# Patient Record
Sex: Female | Born: 1985 | Race: White | Hispanic: No | Marital: Single | Smoking: Never smoker
Health system: Southern US, Community
[De-identification: ages and names within clinical notes are randomized; demographics above are authoritative.]

## PROBLEM LIST (undated history)

## (undated) DIAGNOSIS — J45990 Exercise induced bronchospasm: Secondary | ICD-10-CM

## (undated) HISTORY — PX: WISDOM TOOTH EXTRACTION: SHX21

---
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
## 2007-04-07 ENCOUNTER — Emergency Department (HOSPITAL_COMMUNITY): Admission: EM | Admit: 2007-04-07 | Discharge: 2007-04-07 | Payer: Self-pay | Admitting: Family Medicine

## 2009-01-02 ENCOUNTER — Ambulatory Visit: Payer: Self-pay | Admitting: Unknown Physician Specialty

## 2015-09-04 ENCOUNTER — Encounter (HOSPITAL_COMMUNITY): Payer: Self-pay | Admitting: Vascular Surgery

## 2015-09-04 ENCOUNTER — Emergency Department (HOSPITAL_COMMUNITY): Payer: BLUE CROSS/BLUE SHIELD

## 2015-09-04 ENCOUNTER — Emergency Department (HOSPITAL_COMMUNITY)
Admission: EM | Admit: 2015-09-04 | Discharge: 2015-09-04 | Disposition: A | Discharge status: Home / Self Care | Payer: BLUE CROSS/BLUE SHIELD | Attending: Emergency Medicine | Admitting: Emergency Medicine

## 2015-09-04 DIAGNOSIS — S5001XA Contusion of right elbow, initial encounter: Secondary | ICD-10-CM | POA: Diagnosis not present

## 2015-09-04 DIAGNOSIS — Y9341 Activity, dancing: Secondary | ICD-10-CM | POA: Diagnosis not present

## 2015-09-04 DIAGNOSIS — W01198A Fall on same level from slipping, tripping and stumbling with subsequent striking against other object, initial encounter: Secondary | ICD-10-CM | POA: Diagnosis not present

## 2015-09-04 DIAGNOSIS — Y9289 Other specified places as the place of occurrence of the external cause: Secondary | ICD-10-CM | POA: Diagnosis not present

## 2015-09-04 DIAGNOSIS — S59901A Unspecified injury of right elbow, initial encounter: Secondary | ICD-10-CM | POA: Diagnosis present

## 2015-09-04 DIAGNOSIS — J45909 Unspecified asthma, uncomplicated: Secondary | ICD-10-CM | POA: Diagnosis not present

## 2015-09-04 DIAGNOSIS — Y998 Other external cause status: Secondary | ICD-10-CM | POA: Insufficient documentation

## 2015-09-04 HISTORY — DX: Exercise induced bronchospasm: J45.990

## 2015-09-04 MED ORDER — IBUPROFEN 400 MG PO TABS
400.0000 mg | ORAL_TABLET | Freq: Once | ORAL | Status: DC
  Filled 2015-09-04: qty 1

## 2015-09-04 NOTE — Discharge Instructions (Signed)
Rest, Ice intermittently (in the first 24-48 hours), Gentle compression with an Ace wrap, and elevate (Limb above the level of the heart)   Take up to 800mg  of ibuprofen (that is usually 4 over the counter pills)  3 times a day for 5 days. Take with food.  Only use the arm sling for up to 2 days. Take the arm out and rotate the shoulder every 4 hours.    Contusion A contusion is a deep bruise. Contusions are the result of a blunt injury to tissues and muscle fibers under the skin. The injury causes bleeding under the skin. The skin overlying the contusion may turn blue, purple, or yellow. Minor injuries will give you a painless contusion, but more severe contusions may stay painful and swollen for a few weeks.  CAUSES  This condition is usually caused by a blow, trauma, or direct force to an area of the body. SYMPTOMS  Symptoms of this condition include:  Swelling of the injured area.  Pain and tenderness in the injured area.  Discoloration. The area may have redness and then turn blue, purple, or yellow. DIAGNOSIS  This condition is diagnosed based on a physical exam and medical history. An X-ray, CT scan, or MRI may be needed to determine if there are any associated injuries, such as broken bones (fractures). TREATMENT  Specific treatment for this condition depends on what area of the body was injured. In general, the best treatment for a contusion is resting, icing, applying pressure to (compression), and elevating the injured area. This is often called the RICE strategy. Over-the-counter anti-inflammatory medicines may also be recommended for pain control.  HOME CARE INSTRUCTIONS   Rest the injured area.  If directed, apply ice to the injured area:  Put ice in a plastic bag.  Place a towel between your skin and the bag.  Leave the ice on for 20 minutes, 2-3 times per day.  If directed, apply light compression to the injured area using an elastic bandage. Make sure the bandage  is not wrapped too tightly. Remove and reapply the bandage as directed by your health care provider.  If possible, raise (elevate) the injured area above the level of your heart while you are sitting or lying down.  Take over-the-counter and prescription medicines only as told by your health care provider. SEEK MEDICAL CARE IF:  Your symptoms do not improve after several days of treatment.  Your symptoms get worse.  You have difficulty moving the injured area. SEEK IMMEDIATE MEDICAL CARE IF:   You have severe pain.  You have numbness in a hand or foot.  Your hand or foot turns pale or cold.   This information is not intended to replace advice given to you by your health care provider. Make sure you discuss any questions you have with your health care provider.   Document Released: 07/23/2005 Document Revised: 07/04/2015 Document Reviewed: 02/28/2015 Elsevier Interactive Patient Education [employer]2016 Elsevier Inc.

## 2015-09-04 NOTE — ED Notes (Signed)
Pt reports to the ED for eval of right elbow pain. Pt fell and struck her elbow on the floor. Occurred at approx 10 pm. Full ROM and CMS intact. Denies any head injury or LOC. Some swelling noted to elbow. Pt A&Ox4, resp e/u, and skin warm and dry.

## 2015-09-04 NOTE — ED Provider Notes (Signed)
CSN: [id number]     Arrival date & time 09/04/15  2157 History  By signing my name below, I, Lyndel Safe, attest that this documentation has been prepared under the direction and in the presence of [employer], PA-C. Electronically Signed: Lyndel Safe, ED Scribe. 09/04/2015. 10:13 PM.   Chief Complaint  Patient presents with  . Elbow Pain    The history is provided by the patient. No language interpreter was used.   HPI Comments: Sherry Conway is a 29 y.o. female, with no pertinent PMhx, who presents to the Emergency Department complaining of sudden onset, constant, mild right elbow pain s/p fall that occurred 1 hour ago. Pt reports while dancing she fell on her right elbow onto cement. Pt endorses intermittent numbness in her right fingers that is worse in her right, 3rd digit. Pt is right hand dominant. She has not taken any alleviating medication PTA.   Past Medical History  Diagnosis Date  . Exercise-induced asthma    Past Surgical History  Procedure Laterality Date  . Wisdom tooth extraction     No family history on file. Social History  Substance Use Topics  . Smoking status: Never Smoker   . Smokeless tobacco: Never Used  . Alcohol Use: Yes     Comment: occasionally    OB History    No data available     Review of Systems A complete 10 system review of systems was obtained and is otherwise negative except at noted in the HPI and PMH.  Allergies  Review of patient's allergies indicates not on file.  Home Medications   Prior to Admission medications   Not on File   BP 115/75 mmHg  Pulse 72  Temp(Src) 97.9 F (36.6 C) (Oral)  Resp 16  SpO2 100%  LMP 09/04/2015 Physical Exam  Constitutional: She is oriented to person, place, and time. She appears well-developed and well-nourished. No distress.  HENT:  Head: Normocephalic.  Eyes: Conjunctivae are normal.  Neck: Normal range of motion. Neck supple.  Cardiovascular: Normal rate.    Pulmonary/Chest: Effort normal. No respiratory distress.  Musculoskeletal: Normal range of motion. She exhibits tenderness. She exhibits no edema.  Right elbow with no deformity, she is distally neurovascularly intact. Patient can flex and extend, supinate and pronate the hand. She is mildly tender to palpation along the olecranon process.  Neurological: She is alert and oriented to person, place, and time. Coordination normal.  Skin: Skin is warm.  Psychiatric: She has a normal mood and affect. Her behavior is normal.  Nursing note and vitals reviewed.   ED Course  Procedures  DIAGNOSTIC STUDIES: Oxygen Saturation is 100% on RA, normal by my interpretation.    COORDINATION OF CARE: 10:11 PM Discussed treatment plan with pt at bedside and pt agreed to plan. Xray of right elbow ordered.   Imaging Review Dg Elbow Complete Right  09/04/2015  CLINICAL DATA:  Right elbow pain after injury today. Fall onto right elbow with swelling and bruising. EXAM: RIGHT ELBOW - COMPLETE 3+ VIEW COMPARISON:  None. FINDINGS: No fracture or dislocation. The alignment and joint spaces are maintained. No joint effusion. Mild soft tissue edema posteriorly. IMPRESSION: Soft tissue edema.  No fracture or dislocation. Electronically Signed   By: Rubye Oaks M.D.   On: 09/04/2015 22:52   I have personally reviewed and evaluated these images as part of my medical decision-making.   MDM   Final diagnoses:  Elbow contusion, right, initial encounter    Filed  Vitals:   09/04/15 2204  BP: 115/75  Pulse: 72  Temp: 97.9 F (36.6 C)  TempSrc: Oral  Resp: 16  SpO2: 100%    Medications  ibuprofen (ADVIL,MOTRIN) tablet 400 mg (400 mg Oral Not Given 09/04/15 2340)    Sherry Conway is 29 y.o. female presenting with right elbow pain status post slip and fall. Neurovascularly intact with full range of motion. X-rays negative. Patient given sling and encouraged rest, ice, compression  Evaluation does not  show pathology that would require ongoing emergent intervention or inpatient treatment. Pt is hemodynamically stable and mentating appropriately. Discussed findings and plan with patient/guardian, who agrees with care plan. All questions answered. Return precautions discussed and outpatient follow up given.     I personally performed the services described in this documentation, which was scribed in my presence. The recorded information has been reviewed and is accurate.    Wynetta Emeryicole Rei Contee, PA-C 09/05/15 0045  Linwood DibblesJon Knapp, MD 09/05/15 1045

## 2016-03-18 IMAGING — DX DG ELBOW COMPLETE 3+V*R*
4 series · 4 of 4 positions shown · non-contrast
Comparison: None.

CLINICAL DATA: Right elbow pain after injury today. Fall onto right
elbow with swelling and bruising.

EXAM:
RIGHT ELBOW - COMPLETE 3+ VIEW

[elbow ap]
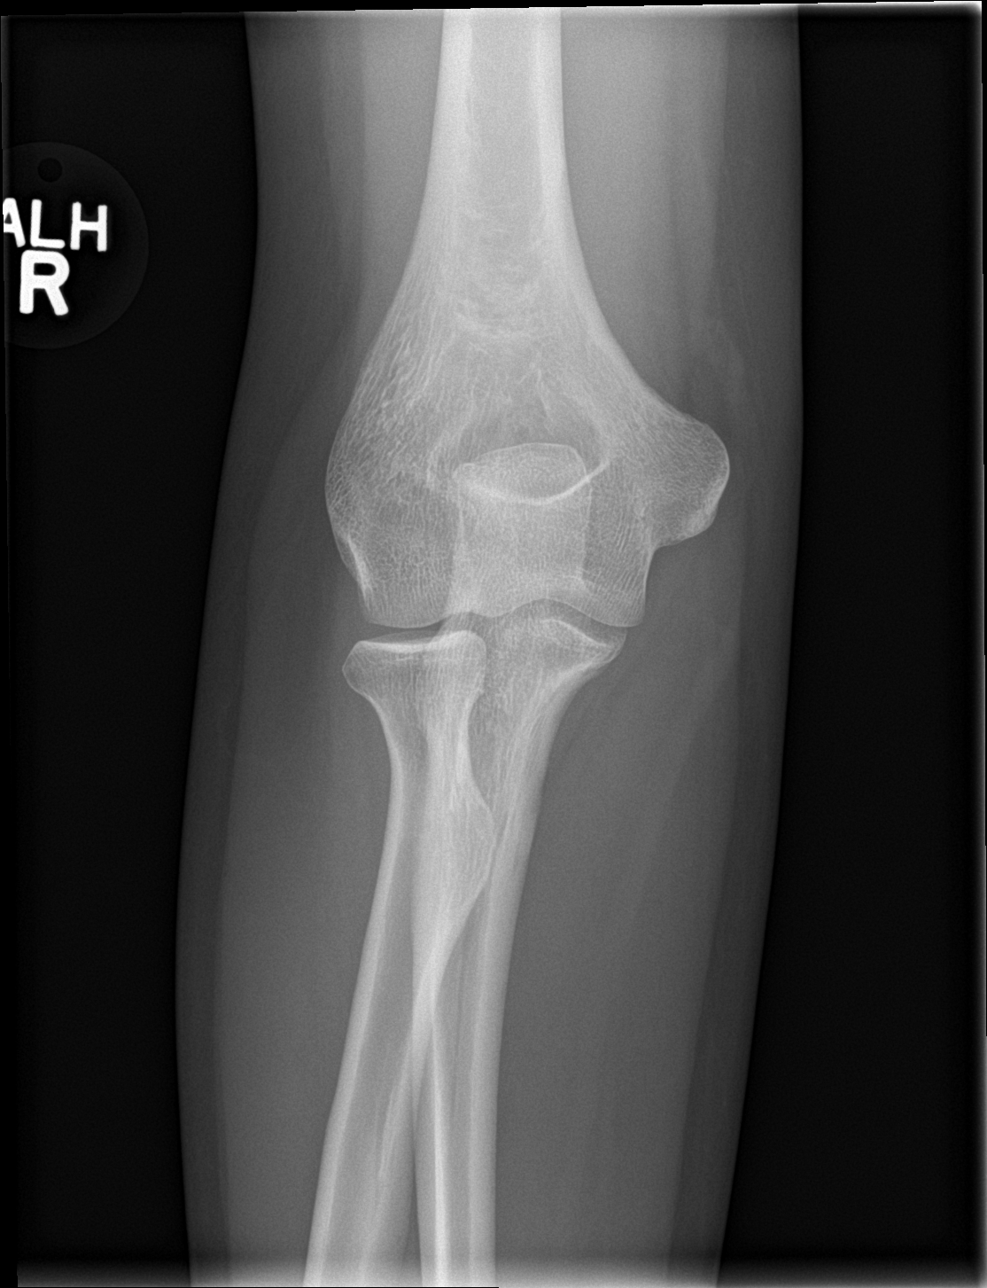

[elbow obl (1 of 2)]
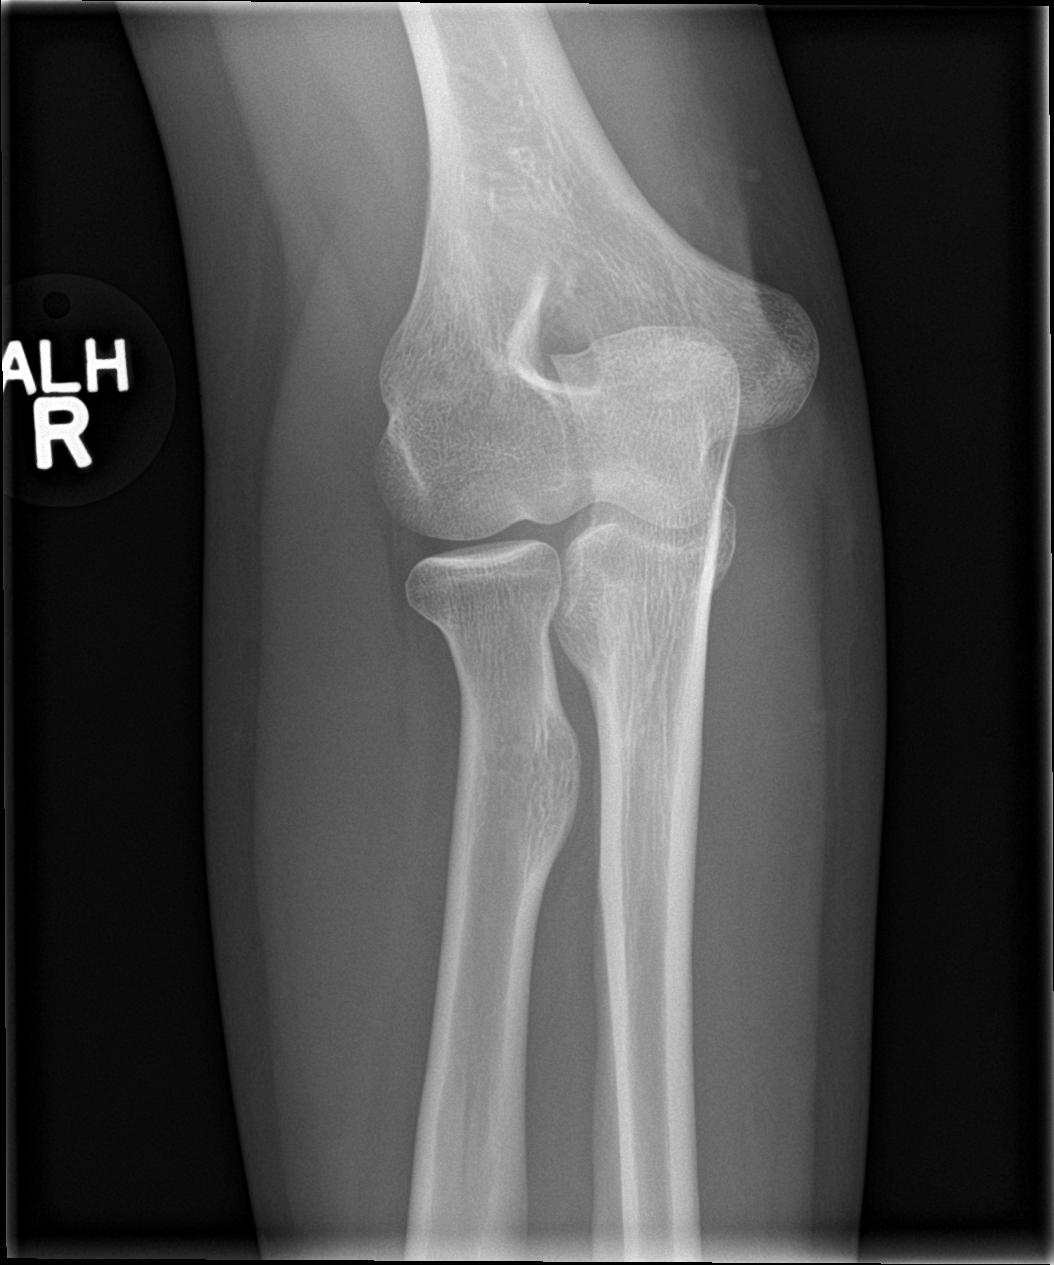

[elbow obl (2 of 2)]
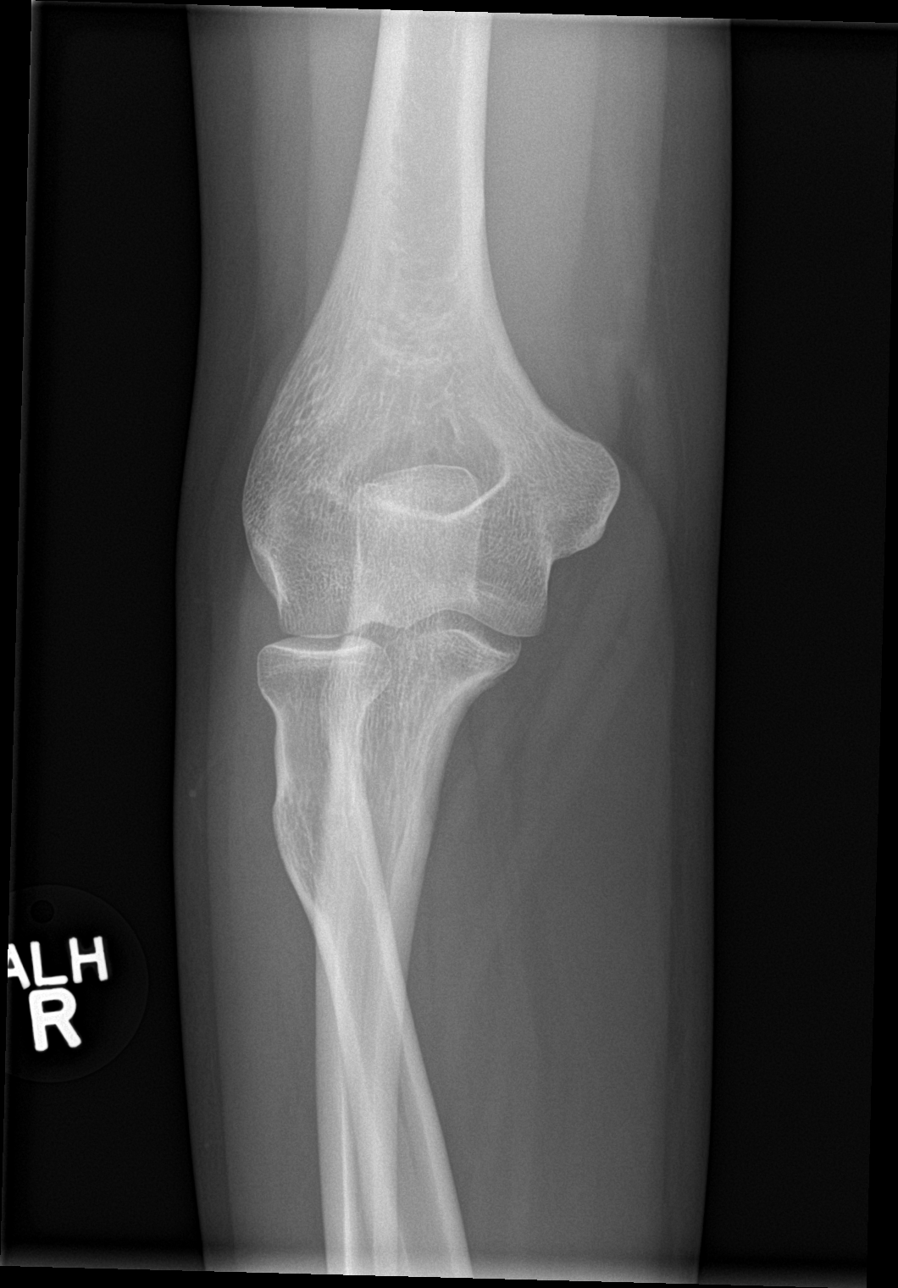

[elbow lat]
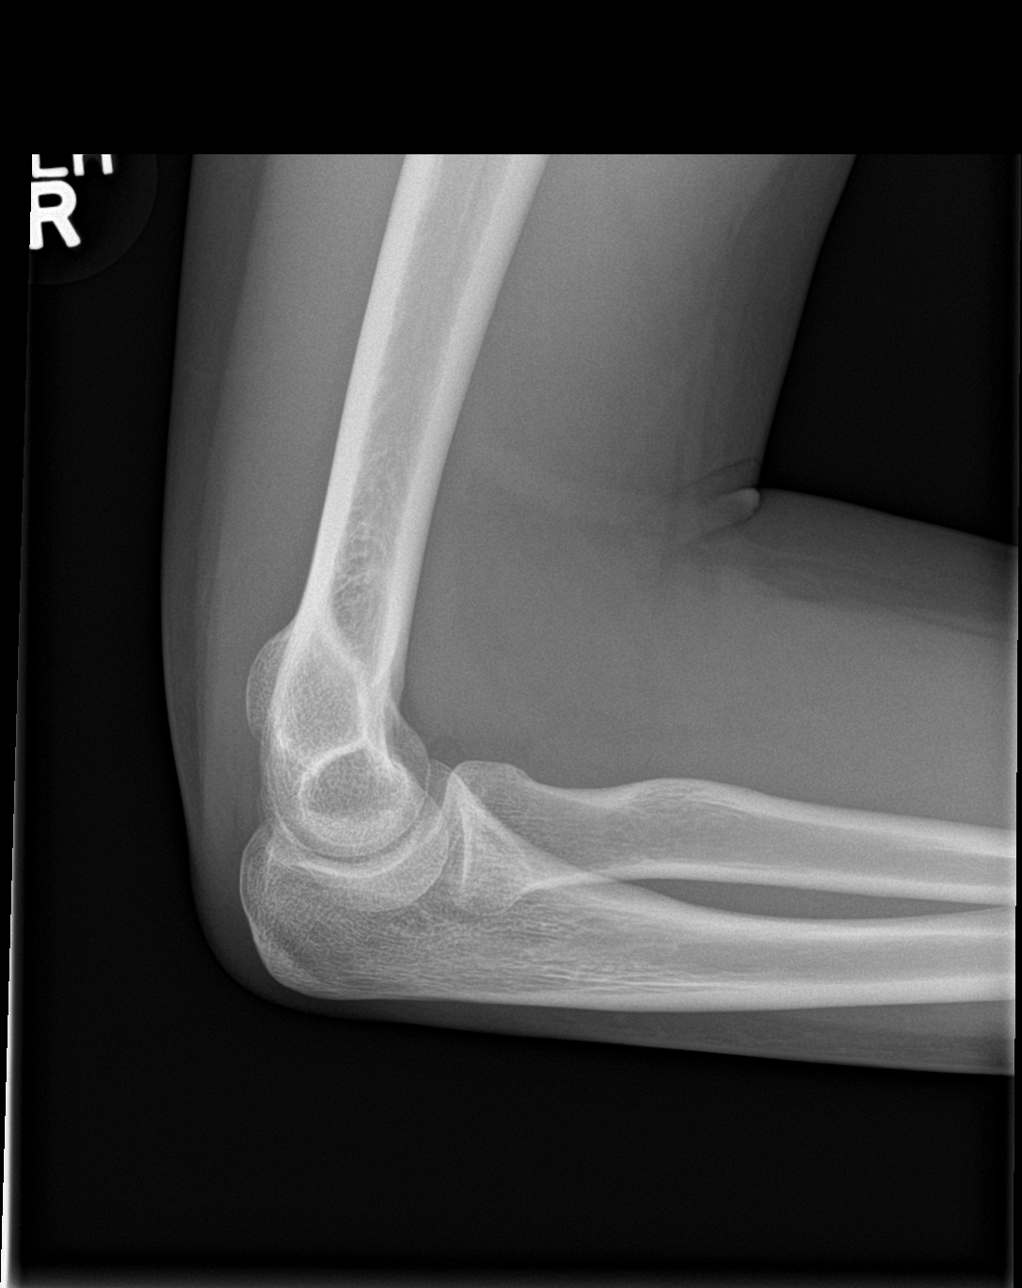

[4 of 4 positions shown; findings below may reference images not displayed]

FINDINGS: No fracture or dislocation. The alignment and joint spaces are
maintained. No joint effusion. Mild soft tissue edema posteriorly.
IMPRESSION: Soft tissue edema.  No fracture or dislocation.

## 2018-12-07 DIAGNOSIS — Z124 Encounter for screening for malignant neoplasm of cervix: Secondary | ICD-10-CM | POA: Diagnosis not present

## 2018-12-07 DIAGNOSIS — Z01419 Encounter for gynecological examination (general) (routine) without abnormal findings: Secondary | ICD-10-CM | POA: Diagnosis not present

## 2018-12-07 DIAGNOSIS — R87612 Low grade squamous intraepithelial lesion on cytologic smear of cervix (LGSIL): Secondary | ICD-10-CM | POA: Diagnosis not present

## 2018-12-07 DIAGNOSIS — Z113 Encounter for screening for infections with a predominantly sexual mode of transmission: Secondary | ICD-10-CM | POA: Diagnosis not present

## 2018-12-07 DIAGNOSIS — Z6822 Body mass index (BMI) 22.0-22.9, adult: Secondary | ICD-10-CM | POA: Diagnosis not present

## 2019-04-20 DIAGNOSIS — Z20828 Contact with and (suspected) exposure to other viral communicable diseases: Secondary | ICD-10-CM | POA: Diagnosis not present

## 2019-05-12 ENCOUNTER — Ambulatory Visit: Payer: Self-pay

## 2019-05-12 ENCOUNTER — Other Ambulatory Visit: Payer: Self-pay

## 2019-05-12 ENCOUNTER — Encounter: Payer: Self-pay | Admitting: Surgery

## 2019-05-12 ENCOUNTER — Ambulatory Visit (INDEPENDENT_AMBULATORY_CARE_PROVIDER_SITE_OTHER): Payer: BC Managed Care – PPO | Admitting: Surgery

## 2019-05-12 DIAGNOSIS — M79672 Pain in left foot: Secondary | ICD-10-CM

## 2019-05-12 NOTE — Progress Notes (Signed)
Office Visit Note   Patient: Sherry Conway           Date of Birth: 01/15/1986           MRN: [id number] Visit Date: 05/12/2019              Requested by: No referring provider defined for this encounter. PCP: Patient, No Pcp Per   Assessment & Plan: Visit Diagnoses:  1. Left foot pain   2. Pain in left foot   possible distal 3rd metatarsal stress fracture  Plan: xrays reviewed with patient.  She was put in a postop shoe.  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  Stressed to her that she should avoid this activity along with running, jumping, etc until this heals.  She may have a stress fracture of the distal 3rd metatarsal.  Will wear shoe at all times when up and ambulating. Voices understanding.  Follow up with me in one week for recheck.   Follow-Up Instructions: Return in about 1 week (around 05/19/2019) for with Ravleen Ries recheck left foot. .   Orders:  Orders Placed This Encounter  Procedures  . XR Foot Complete Left   No orders of the defined types were placed in this encounter.     Procedures: No procedures performed   Clinical Data: No additional findings.   Subjective: Chief Complaint  Patient presents with  . Left Foot - Injury, Pain    HPI 33 year old white female comes in today with complaints of right foot pain.  Patient is a Environmental consultantdance school owner/instructor and states that last night she was clogging wearing tennis shoes when she stepped down and felt a sharp pain around the right third metatarsal head.  Later she did notice some swelling and bruising around the area.  Pain in the foot with weightbearing.  Used ice off and on last night which she thinks helped the swelling but not the pain. Review of Systems No current cardiac pulmonary gi GU complaints  Objective: Vital Signs: There were no vitals taken for this visit.  Physical Exam HENT:     Head: Normocephalic and atraumatic.  Eyes:     Extraocular Movements: Extraocular  movements intact.     Pupils: Pupils are equal, round, and reactive to light.  Pulmonary:     Effort: No respiratory distress.  Musculoskeletal:        General: Swelling (Mild swelling around the third MTP joint.) present.     Comments: Patient is moderate to marked TTP around the distal third metatarsal.  No deformity.  Neurovascular intact.  Skin:    General: Skin is warm and dry.     Findings: Bruising (Trace bruising dorsal foot around the third MTP joint.) present.  Neurological:     General: No focal deficit present.     Mental Status: She is alert and oriented to person, place, and time.  Psychiatric:        Mood and Affect: Mood normal.        Behavior: Behavior normal.     Ortho Exam  Specialty Comments:  No specialty comments available.  Imaging: Xr Foot Complete Left  Result Date: 05/12/2019 X-ray right foot does not show an obvious fracture.    PMFS History: There are no active problems to display for this patient.  Past Medical History:  Diagnosis Date  . Exercise-induced asthma     History reviewed. No pertinent family history.  Past Surgical History:  Procedure Laterality  Date  . WISDOM TOOTH EXTRACTION     Social History   Occupational History  . Not on file  Tobacco Use  . Smoking status: Never Smoker  . Smokeless tobacco: Never Used  Substance and Sexual Activity  . Alcohol use: Yes    Comment: occasionally   . Drug use: No  . Sexual activity: Not on file

## 2019-05-18 ENCOUNTER — Ambulatory Visit: Payer: BC Managed Care – PPO | Admitting: Orthopaedic Surgery

## 2019-05-19 ENCOUNTER — Ambulatory Visit: Payer: Self-pay

## 2019-05-19 ENCOUNTER — Ambulatory Visit (INDEPENDENT_AMBULATORY_CARE_PROVIDER_SITE_OTHER): Payer: BC Managed Care – PPO | Admitting: Surgery

## 2019-05-19 ENCOUNTER — Encounter: Payer: Self-pay | Admitting: Surgery

## 2019-05-19 ENCOUNTER — Other Ambulatory Visit: Payer: Self-pay

## 2019-05-19 DIAGNOSIS — M79672 Pain in left foot: Secondary | ICD-10-CM | POA: Diagnosis not present

## 2019-05-19 NOTE — Progress Notes (Signed)
   Office Visit Note   Patient: Sherry Conway           Date of Birth: [birth year]           MRN: [id number] Visit Date: 05/19/2019              Requested by: No referring provider defined for this encounter. PCP: Patient, No Pcp Per   Assessment & Plan: Visit Diagnoses:  1. Pain in left foot     Plan: Patient is somewhat more tender on exam today than her previous visit.  I put her in a short cam boot and she was given crutches.  She will be non-to touchdown weightbearing with crutches until I see her back in 1 week.  Next visit if she continues to be sore and not getting any better I may consider getting an MRI at that time to rule out fracture that I do not obviously see on x-ray.  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  Patient understands no aggressive activity and being compliant with instructions to help promote healing.  Today's x-rays reviewed and all questions answered.  Follow-Up Instructions: Return in about 1 week (around 05/26/2019) for with [facility] recheck foot. .   Orders:  Orders Placed This Encounter  Procedures  . XR Foot Complete Left   No orders of the defined types were placed in this encounter.     Procedures: No procedures performed   Clinical Data: No additional findings.   Subjective: Chief Complaint  Patient presents with  . Left Foot - Follow-up    HPI 33 year old female returns for recheck of her left foot pain.  She has been compliant with wearing the postop shoe.  She has continued to have some soreness around the distal third metatarsal.  Since last visit states that she did have an event where she jumped up at her school and she came down hard while wearing the shoe.  Pain somewhat increased since that time.     Objective: Vital Signs: There were no vitals taken for this visit.   Physical Exam Extremely pleasant female alert and oriented in no acute distress.  Gait is antalgic.  Left foot there is possibly some mild swelling around the distal third metatarsal.  No obvious bruising.  She is exquisitely tender at the distal third metatarsal.  No bony deformity.  Neurovascular intact. Ortho Exam  Specialty Comments:  No specialty comments available.  Imaging: No results found.   PMFS History: There are no active problems to display for this patient.  Past Medical History:  Diagnosis Date  . Exercise-induced asthma     No family history on file.  Past Surgical History:  Procedure Laterality Date  . WISDOM TOOTH EXTRACTION     Social History   Occupational History  . Not on file  Tobacco Use  . Smoking status: Never Smoker  . Smokeless tobacco: Never Used  Substance and Sexual Activity  . Alcohol use: Yes    Comment: occasionally   . Drug use: No  . Sexual activity: Not on file

## 2019-05-25 ENCOUNTER — Ambulatory Visit (INDEPENDENT_AMBULATORY_CARE_PROVIDER_SITE_OTHER): Payer: BC Managed Care – PPO | Admitting: Surgery

## 2019-05-25 ENCOUNTER — Other Ambulatory Visit: Payer: Self-pay

## 2019-05-25 ENCOUNTER — Encounter: Payer: Self-pay | Admitting: Surgery

## 2019-05-25 VITALS — BP 121/76 | HR 67

## 2019-05-25 DIAGNOSIS — M79672 Pain in left foot: Secondary | ICD-10-CM | POA: Diagnosis not present

## 2019-05-26 ENCOUNTER — Encounter: Payer: Self-pay | Admitting: Surgery

## 2019-05-26 NOTE — Progress Notes (Signed)
   Office Visit Note   Patient: Sherry Conway           Date of Birth: 1986-01-22           MRN: [id number] Visit Date: 05/25/2019              Requested by: No referring provider defined for this encounter. PCP: Patient, No Pcp Per   Assessment & Plan: Visit Diagnoses:  1. Pain in left foot   Possible distal third metatarsal stress fracture.  Plan: We will have patient continue short cam boot until I see her back in a couple of weeks.  She is planning on going to the beach next week so I will hold off on getting an MRI and will make decision on that when I see her back.  Stressed her the importance of being compliant wearing the boot.  Told her that she should not be getting in and out of the pool or walking on the sand or in the ocean.  She will come out of the boot at times to work range of motion of her ankle to prevent any stiffness that she might get there.  Elevate foot as much as possible to help decrease swelling.  She will contact me if there are any questions or concerns before her appointment.  Follow-Up Instructions: Return in about 15 days (around 06/09/2019).   Orders:  No orders of the defined types were placed in this encounter.  No orders of the defined types were placed in this encounter.     Procedures: No procedures performed   Clinical Data: No additional findings.   Subjective: Chief Complaint  Patient presents with  . Left Foot - Follow-up    HPI 33 year old white female returns for recheck of her left foot pain and possible distal third metatarsal stress fracture.  She states that she is not really gotten any better.  She has been trying to be compliant with weightbearing restrictions in her boot.  States that she does live alone so this has been challenging.  Thinks that she still continues to have swelling in her foot.   Objective: Vital Signs: BP 121/76   Pulse 67   Physical Exam She does have mild swelling of the mid to forefoot.  No  obvious bruising.  She is exquisitely tender over the distal third metatarsal.  Neurovascular intact.  Today she is also mildly tender around the second and fourth metatarsal as well but definitely not as bad as a third. Ortho Exam  Specialty Comments:  No specialty comments available.  Imaging: No results found.   PMFS History: There are no active problems to display for this patient.  Past Medical History:  Diagnosis Date  . Exercise-induced asthma     No family history on file.  Past Surgical History:  Procedure Laterality Date  . WISDOM TOOTH EXTRACTION     Social History   Occupational History  . Not on file  Tobacco Use  . Smoking status: Never Smoker  . Smokeless tobacco: Never Used  Substance and Sexual Activity  . Alcohol use: Yes    Comment: occasionally   . Drug use: No  . Sexual activity: Not on file

## 2019-06-09 ENCOUNTER — Ambulatory Visit (INDEPENDENT_AMBULATORY_CARE_PROVIDER_SITE_OTHER): Payer: BC Managed Care – PPO | Admitting: Surgery

## 2019-06-09 ENCOUNTER — Encounter: Payer: Self-pay | Admitting: Surgery

## 2019-06-09 ENCOUNTER — Ambulatory Visit: Payer: BC Managed Care – PPO

## 2019-06-09 DIAGNOSIS — M79672 Pain in left foot: Secondary | ICD-10-CM | POA: Diagnosis not present

## 2019-06-09 NOTE — Progress Notes (Signed)
   Office Visit Note   Patient: Sherry Conway           Date of Birth: 12-26-1985           MRN: [id number] Visit Date: 06/09/2019              Requested by: No referring provider defined for this encounter. PCP: Patient, No Pcp Per   Assessment & Plan: Visit Diagnoses:  1. Pain in left foot     Plan: I think it is reasonable for patient to transition to a postop shoe at this time.  I encouraged her to try to walk a little more normal while using the shoe and she can use crutches to weight-bear as tolerated.  Hold off on walking on the heel. X-rays reviewed with patient.  I think since she is improving we can hold off on getting an MRI.  We will see how she feels in 2 weeks when she is about 6 weeks out from injury.  Will call if there are any worsening issues.  Follow-Up Instructions: Return in about 2 weeks (around 06/23/2019) for With [facility] recheck foot.   Orders:  Orders Placed This Encounter  Procedures  . XR Foot Complete Left   No orders of the defined types were placed in this encounter.     Procedures: No procedures performed   Clinical Data: No additional findings.   Subjective: Chief Complaint  Patient presents with  . Left Foot - Follow-up    HPI 33 year old white female returns for recheck of her left foot possible distal third metatarsal stress fracture.  She has been compliant with wearing the cam boot.  Complaining of decreased pain at the distal third metatarsal.  She has been trying to walk more on her heel in the boot which is causing some discomfort at the plantar fascia calcaneal insertion.  No problems while she was at the beach.   Objective: Vital Signs: There were no vitals taken for this visit.  Physical Exam Patient still does have some tenderness at the distal third that is aggravated somewhat with toe dorsiflexion.  But this is decreased from previous exam a couple weeks ago.  No swelling or bruising.  She does have some mild  tenderness over the dorsal second metatarsal and this is somewhat aggravated with toe dorsiflexion.  Neurovascular intact. Ortho Exam  Specialty Comments:  No specialty comments available.  Imaging: No results found.   PMFS History: There are no active problems to display for this patient.  Past Medical History:  Diagnosis Date  . Exercise-induced asthma     No family history on file.  Past Surgical History:  Procedure Laterality Date  . WISDOM TOOTH EXTRACTION     Social History   Occupational History  . Not on file  Tobacco Use  . Smoking status: Never Smoker  . Smokeless tobacco: Never Used  Substance and Sexual Activity  . Alcohol use: Yes    Comment: occasionally   . Drug use: No  . Sexual activity: Not on file

## 2019-06-22 ENCOUNTER — Ambulatory Visit: Payer: BC Managed Care – PPO | Admitting: Surgery

## 2019-06-27 ENCOUNTER — Telehealth: Payer: Self-pay | Admitting: Surgery

## 2019-06-27 ENCOUNTER — Other Ambulatory Visit: Payer: Self-pay | Admitting: Surgery

## 2019-06-27 DIAGNOSIS — M79672 Pain in left foot: Secondary | ICD-10-CM

## 2019-06-27 NOTE — Telephone Encounter (Signed)
Please advise 

## 2019-06-27 NOTE — Telephone Encounter (Signed)
Order for MRI left foot entered. I called patient and advised that facility will call to schedule once we have insurance authorization. I did explain to patient that Sherry Conway would like for her to follow up and review MRI with Dr. Marlou Sa.

## 2019-06-27 NOTE — Telephone Encounter (Signed)
Please order MRI left foot.  Rule out distal 3rd distal metatarsal boney lesion stress reaction), soft tissue injury and 2nd and 3rd Morton's neuroma.  Schedule rov with Dr Marlou Sa after for exam and discuss results.

## 2019-06-27 NOTE — Telephone Encounter (Signed)
Patient called advised left foot is hurting her more than expected. Patient asked if Jeneen Rinks can call her to discuss how she is feeling. The number to contact patient is [phone]

## 2019-06-28 ENCOUNTER — Telehealth: Payer: Self-pay | Admitting: Surgery

## 2019-06-28 NOTE — Telephone Encounter (Signed)
I spoke with this 33 year old patient yesterday regarding her left foot.  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  States that since last office visit with me she has had ongoing pain and some swelling of the forefoot.  Continues localized pain to the distal third metatarsal but is also had some discomfort around the distal second as well.  She has been going back and forth with the cam boot and postop shoe.  At this time I think that we have given this long enough to see if it would get better on its own.  She has a very demanding job with what she does and this lingering problem is having a significant negative impact on that.  I recommend getting an MRI of the left foot to rule out bony lesion of the distal third and second metatarsal, soft tissue injury, and also second and third Morton's neuroma.  Follow-up with Dr. Alphonzo Severance to review study after completed and to discuss further options at that point.

## 2019-06-28 NOTE — Addendum Note (Signed)
Addended by: Daylene Posey T on: 06/28/2019 10:42 AM   Modules accepted: Orders

## 2019-06-29 ENCOUNTER — Ambulatory Visit: Payer: BC Managed Care – PPO | Admitting: Surgery

## 2019-06-30 ENCOUNTER — Telehealth: Payer: Self-pay | Admitting: *Deleted

## 2019-06-30 NOTE — Telephone Encounter (Signed)
Sherry Conway is wanting pt to be seen with Dr. Marlou Sa for follow up MRI ankle/foot, pt is scheduled on 07/07/19 at 945am, tried calling pt to inform her of appt no anwer, vm full.

## 2019-07-01 NOTE — Telephone Encounter (Signed)
I called pt left vm with appt information

## 2019-07-06 DIAGNOSIS — M79672 Pain in left foot: Secondary | ICD-10-CM | POA: Diagnosis not present

## 2019-07-07 ENCOUNTER — Encounter: Payer: Self-pay | Admitting: Orthopedic Surgery

## 2019-07-07 ENCOUNTER — Ambulatory Visit (INDEPENDENT_AMBULATORY_CARE_PROVIDER_SITE_OTHER): Payer: BC Managed Care – PPO | Admitting: Orthopedic Surgery

## 2019-07-07 DIAGNOSIS — M79672 Pain in left foot: Secondary | ICD-10-CM

## 2019-07-08 ENCOUNTER — Telehealth: Payer: Self-pay | Admitting: Surgical

## 2019-07-08 NOTE — Telephone Encounter (Signed)
Discussed MRI results.Pt will stick with the plan from her visit on 9/10 and will return to the office if little to no improvement in the next month

## 2019-07-09 ENCOUNTER — Encounter: Payer: Self-pay | Admitting: Orthopedic Surgery

## 2019-07-09 NOTE — Progress Notes (Signed)
Office Visit Note   Patient: Mitsue C Gorder           Date of Birth: 1986-02-11           MRN: [id number] Visit Date: 07/07/2019 Requested by: No referring provider defined for this encounter. PCP: Patient, No Pcp Per  Subjective: Chief Complaint  Patient presents with  . Left Foot - Follow-up    HPI: Djeneba C Shelton SilvasStraughn is a 33 y.o. female who presents to the office complaining of left foot pain.  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  She noticed immediate pain, swelling with eventual bruising of the plantar aspect of the left foot.  She notes tingling, burning pain.  She has not been taking any medications for pain.  She notes numbness in the 2nd-4th toes.  She has been walking with a postoperative fracture shoe. 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  She has not been able to return to dance.              ROS:  All systems reviewed are negative as they relate to the chief complaint within the history of present illness.  Patient denies fevers or chills.  Assessment & Plan: Visit Diagnoses: No diagnosis found.  Plan: Patient is a 33 year old female who presents following injury 7 weeks ago.  She has numbness in the second through fourth toes and burning quality pain around the 3rd-4th metatarsal head.  MRI of the left foot reviewed with patient and revealed soft tissue swelling just distal to the second third fourth metatarsal heads.  There is no evidence of a Morton's neuroma or stress fracture.  Recommended patient try to resume walking in a normal shoe as she can tolerate and slowly transition to walking more normally and handling stairs.  When she is comfortable handling stairs without any pain I think she should be able to try returning to dancing.  Patient agreed with this plan and will follow-up with the office if her pain does not improve.  Follow-Up  Instructions: No follow-ups on file.   Orders:  No orders of the defined types were placed in this encounter.  No orders of the defined types were placed in this encounter.     Procedures: No procedures performed   Clinical Data: No additional findings.  Objective: Vital Signs: There were no vitals taken for this visit.  Physical Exam:  Constitutional: Patient appears well-developed HEENT:  Head: Normocephalic Eyes:EOM are normal Neck: Normal range of motion Cardiovascular: Normal rate Pulmonary/chest: Effort normal Neurologic: Patient is alert Skin: Skin is warm Psychiatric: Patient has normal mood and affect  Ortho Exam:  Left foot exam No pain throughout the hindfoot No pain with stress of the Lisfranc ligament Intact sensation of the first and fifth digits.  Somewhat reduced sensation of the second, third, fourth digits TTP over the distal third metatarsal and distal fourth metatarsal No significant bruising or swelling is seen  Specialty Comments:  No specialty comments available.  Imaging: No results found.   PMFS History: There are no active problems to display for this patient.  Past Medical History:  Diagnosis Date  . Exercise-induced asthma     History reviewed. No pertinent family history.  Past Surgical History:  Procedure Laterality Date  . WISDOM TOOTH EXTRACTION     Social History   Occupational History  .  Not on file  Tobacco Use  . Smoking status: Never Smoker  . Smokeless tobacco: Never Used  Substance and Sexual Activity  . Alcohol use: Yes    Comment: occasionally   . Drug use: No  . Sexual activity: Not on file

## 2019-07-11 ENCOUNTER — Encounter: Payer: Self-pay | Admitting: Orthopedic Surgery

## 2019-08-31 DIAGNOSIS — J209 Acute bronchitis, unspecified: Secondary | ICD-10-CM | POA: Diagnosis not present

## 2019-09-13 DIAGNOSIS — N760 Acute vaginitis: Secondary | ICD-10-CM | POA: Diagnosis not present

## 2019-09-13 DIAGNOSIS — Z6822 Body mass index (BMI) 22.0-22.9, adult: Secondary | ICD-10-CM | POA: Diagnosis not present

## 2019-10-10 DIAGNOSIS — Z20828 Contact with and (suspected) exposure to other viral communicable diseases: Secondary | ICD-10-CM | POA: Diagnosis not present

## 2019-10-17 DIAGNOSIS — Z20828 Contact with and (suspected) exposure to other viral communicable diseases: Secondary | ICD-10-CM | POA: Diagnosis not present

## 2019-10-17 DIAGNOSIS — J069 Acute upper respiratory infection, unspecified: Secondary | ICD-10-CM | POA: Diagnosis not present

## 2020-05-16 DIAGNOSIS — L299 Pruritus, unspecified: Secondary | ICD-10-CM | POA: Diagnosis not present

## 2020-05-16 DIAGNOSIS — H938X1 Other specified disorders of right ear: Secondary | ICD-10-CM | POA: Diagnosis not present

## 2020-05-16 DIAGNOSIS — H6123 Impacted cerumen, bilateral: Secondary | ICD-10-CM | POA: Diagnosis not present

## 2020-07-03 DIAGNOSIS — Z20822 Contact with and (suspected) exposure to covid-19: Secondary | ICD-10-CM | POA: Diagnosis not present

## 2020-10-03 DIAGNOSIS — Z6822 Body mass index (BMI) 22.0-22.9, adult: Secondary | ICD-10-CM | POA: Diagnosis not present

## 2020-10-03 DIAGNOSIS — H5711 Ocular pain, right eye: Secondary | ICD-10-CM | POA: Diagnosis not present

## 2020-10-03 DIAGNOSIS — H16101 Unspecified superficial keratitis, right eye: Secondary | ICD-10-CM | POA: Diagnosis not present

## 2020-10-03 DIAGNOSIS — H1011 Acute atopic conjunctivitis, right eye: Secondary | ICD-10-CM | POA: Diagnosis not present

## 2020-10-05 DIAGNOSIS — H16101 Unspecified superficial keratitis, right eye: Secondary | ICD-10-CM | POA: Diagnosis not present

## 2020-10-16 DIAGNOSIS — R87612 Low grade squamous intraepithelial lesion on cytologic smear of cervix (LGSIL): Secondary | ICD-10-CM | POA: Diagnosis not present

## 2020-10-16 DIAGNOSIS — Z6822 Body mass index (BMI) 22.0-22.9, adult: Secondary | ICD-10-CM | POA: Diagnosis not present

## 2020-10-16 DIAGNOSIS — Z124 Encounter for screening for malignant neoplasm of cervix: Secondary | ICD-10-CM | POA: Diagnosis not present

## 2020-10-16 DIAGNOSIS — Z01419 Encounter for gynecological examination (general) (routine) without abnormal findings: Secondary | ICD-10-CM | POA: Diagnosis not present

## 2021-01-17 DIAGNOSIS — R197 Diarrhea, unspecified: Secondary | ICD-10-CM | POA: Diagnosis not present

## 2021-01-17 DIAGNOSIS — M62830 Muscle spasm of back: Secondary | ICD-10-CM | POA: Diagnosis not present

## 2021-01-17 DIAGNOSIS — Z6822 Body mass index (BMI) 22.0-22.9, adult: Secondary | ICD-10-CM | POA: Diagnosis not present

## 2021-01-17 DIAGNOSIS — H6092 Unspecified otitis externa, left ear: Secondary | ICD-10-CM | POA: Diagnosis not present

## 2021-01-24 DIAGNOSIS — H938X1 Other specified disorders of right ear: Secondary | ICD-10-CM | POA: Diagnosis not present

## 2021-09-10 DIAGNOSIS — M7912 Myalgia of auxiliary muscles, head and neck: Secondary | ICD-10-CM | POA: Diagnosis not present

## 2021-09-10 DIAGNOSIS — M5385 Other specified dorsopathies, thoracolumbar region: Secondary | ICD-10-CM | POA: Diagnosis not present

## 2021-09-10 DIAGNOSIS — M9901 Segmental and somatic dysfunction of cervical region: Secondary | ICD-10-CM | POA: Diagnosis not present

## 2021-09-10 DIAGNOSIS — M9902 Segmental and somatic dysfunction of thoracic region: Secondary | ICD-10-CM | POA: Diagnosis not present

## 2021-09-17 DIAGNOSIS — M9902 Segmental and somatic dysfunction of thoracic region: Secondary | ICD-10-CM | POA: Diagnosis not present

## 2021-09-17 DIAGNOSIS — M7912 Myalgia of auxiliary muscles, head and neck: Secondary | ICD-10-CM | POA: Diagnosis not present

## 2021-09-17 DIAGNOSIS — M5385 Other specified dorsopathies, thoracolumbar region: Secondary | ICD-10-CM | POA: Diagnosis not present

## 2021-09-17 DIAGNOSIS — M9901 Segmental and somatic dysfunction of cervical region: Secondary | ICD-10-CM | POA: Diagnosis not present

## 2021-09-24 DIAGNOSIS — M9902 Segmental and somatic dysfunction of thoracic region: Secondary | ICD-10-CM | POA: Diagnosis not present

## 2021-09-24 DIAGNOSIS — M9901 Segmental and somatic dysfunction of cervical region: Secondary | ICD-10-CM | POA: Diagnosis not present

## 2021-09-24 DIAGNOSIS — M7912 Myalgia of auxiliary muscles, head and neck: Secondary | ICD-10-CM | POA: Diagnosis not present

## 2021-09-24 DIAGNOSIS — M5385 Other specified dorsopathies, thoracolumbar region: Secondary | ICD-10-CM | POA: Diagnosis not present

## 2021-10-17 DIAGNOSIS — Z130 Encounter for screening for diseases of the blood and blood-forming organs and certain disorders involving the immune mechanism: Secondary | ICD-10-CM | POA: Diagnosis not present

## 2021-10-17 DIAGNOSIS — Z01419 Encounter for gynecological examination (general) (routine) without abnormal findings: Secondary | ICD-10-CM | POA: Diagnosis not present

## 2021-10-17 DIAGNOSIS — Z6823 Body mass index (BMI) 23.0-23.9, adult: Secondary | ICD-10-CM | POA: Diagnosis not present

## 2021-10-17 DIAGNOSIS — Z13220 Encounter for screening for lipoid disorders: Secondary | ICD-10-CM | POA: Diagnosis not present

## 2021-10-17 DIAGNOSIS — Z1329 Encounter for screening for other suspected endocrine disorder: Secondary | ICD-10-CM | POA: Diagnosis not present

## 2021-11-28 DIAGNOSIS — Z3202 Encounter for pregnancy test, result negative: Secondary | ICD-10-CM | POA: Diagnosis not present

## 2021-11-28 DIAGNOSIS — Z3040 Encounter for surveillance of contraceptives, unspecified: Secondary | ICD-10-CM | POA: Diagnosis not present

## 2021-11-28 DIAGNOSIS — Z319 Encounter for procreative management, unspecified: Secondary | ICD-10-CM | POA: Diagnosis not present

## 2021-12-21 DIAGNOSIS — R0600 Dyspnea, unspecified: Secondary | ICD-10-CM | POA: Diagnosis not present

## 2021-12-21 DIAGNOSIS — J209 Acute bronchitis, unspecified: Secondary | ICD-10-CM | POA: Diagnosis not present

## 2021-12-21 DIAGNOSIS — J218 Acute bronchiolitis due to other specified organisms: Secondary | ICD-10-CM | POA: Diagnosis not present

## 2022-04-03 DIAGNOSIS — Z3040 Encounter for surveillance of contraceptives, unspecified: Secondary | ICD-10-CM | POA: Diagnosis not present

## 2022-04-03 DIAGNOSIS — F909 Attention-deficit hyperactivity disorder, unspecified type: Secondary | ICD-10-CM | POA: Diagnosis not present

## 2022-06-04 NOTE — Progress Notes (Deleted)
Psychiatric Initial Adult Assessment   Patient Identification: Sherry Conway MRN:  [id number] Date of Evaluation:  06/04/2022 Referral Source: *** Chief Complaint:  No chief complaint on file.  Visit Diagnosis: No diagnosis found.  History of Present Illness:   Sherry Conway is a 36 y.o. year old female with a history of adhd, endometriosis, who is referred for ADHD.   Conception?    Daily routine: Diet:  Exercise: Support: Household:  Marital status: Number of children: Employment:  Education:   Last PCP / ongoing medical evaluation:      Associated Signs/Symptoms: Depression Symptoms:  {DEPRESSION SYMPTOMS:20000} (Hypo) Manic Symptoms:  {BHH MANIC SYMPTOMS:22872} Anxiety Symptoms:  {BHH ANXIETY SYMPTOMS:22873} Psychotic Symptoms:  {BHH PSYCHOTIC SYMPTOMS:22874} PTSD Symptoms: {BHH PTSD SYMPTOMS:22875}  Past Psychiatric History:  Outpatient:  Psychiatry admission:  Previous suicide attempt:  Past trials of medication:  History of violence:    Previous Psychotropic Medications: {YES/NO:21197}  Substance Abuse History in the last 12 months:  {yes no:314532}  Consequences of Substance Abuse: {BHH CONSEQUENCES OF SUBSTANCE ABUSE:22880}  Past Medical History:  Past Medical History:  Diagnosis Date   Exercise-induced asthma     Past Surgical History:  Procedure Laterality Date   WISDOM TOOTH EXTRACTION      Family Psychiatric History: ***  Family History: No family history on file.  Social History:   Social History   Socioeconomic History   Marital status: Single    Spouse name: Not on file   Number of children: Not on file   Years of education: Not on file   Highest education level: Not on file  Occupational History   Not on file  Tobacco Use   Smoking status: Never   Smokeless tobacco: Never  Substance and Sexual Activity   Alcohol use: Yes    Comment: occasionally    Drug use: No   Sexual activity: Not on file  Other Topics Concern    Not on file  Social History Narrative   Not on file   Social Determinants of Health   Financial Resource Strain: Not on file  Food Insecurity: Not on file  Transportation Needs: Not on file  Physical Activity: Not on file  Stress: Not on file  Social Connections: Not on file    Additional Social History: ***  Allergies:   Allergies  Allergen Reactions   Macrobid [Nitrofurantoin]     Fast heart rate   Urelle     Fast heart rate    Metabolic Disorder Labs: No results found for: "HGBA1C", "MPG" No results found for: "PROLACTIN" No results found for: "CHOL", "TRIG", "HDL", "CHOLHDL", "VLDL", "LDLCALC" No results found for: "TSH"  Therapeutic Level Labs: No results found for: "LITHIUM" No results found for: "CBMZ" No results found for: "VALPROATE"  Current Medications: No current outpatient medications on file.   No current facility-administered medications for this visit.    Musculoskeletal: Strength & Muscle Tone:  N/A Gait & Station:  N/A Patient leans: N/A  Psychiatric Specialty Exam: Review of Systems  There were no vitals taken for this visit.There is no height or weight on file to calculate BMI.  General Appearance: {Appearance:22683}  Eye Contact:  {BHH EYE CONTACT:22684}  Speech:  Clear and Coherent  Volume:  Normal  Mood:  {BHH MOOD:22306}  Affect:  {Affect (PAA):22687}  Thought Process:  Coherent  Orientation:  Full (Time, Place, and Person)  Thought Content:  Logical  Suicidal Thoughts:  {ST/HT (PAA):22692}  Homicidal Thoughts:  {ST/HT (PAA):22692}  Memory:  Immediate;   Good  Judgement:  {Judgement (PAA):22694}  Insight:  {Insight (PAA):22695}  Psychomotor Activity:  Normal  Concentration:  Concentration: Good and Attention Span: Good  Recall:  Good  Fund of Knowledge:Good  Language: Good  Akathisia:  No  Handed:  Right  AIMS (if indicated):  not done  Assets:  Communication Skills Desire for Improvement  ADL's:  Intact  Cognition:  WNL  Sleep:  {BHH GOOD/FAIR/POOR:22877}   Screenings:   Assessment and Plan:  Assessment  Plan   The patient demonstrates the following risk factors for suicide: Chronic risk factors for suicide include: {Chronic Risk Factors for NTIRWER:15400867}. Acute risk factors for suicide include: {Acute Risk Factors for YPPJKDT:26712458}. Protective factors for this patient include: {Protective Factors for Suicide KDXI:33825053}. Considering these factors, the overall suicide risk at this point appears to be {Desc; low/moderate/high:110033}. Patient {ACTION; IS/IS ZJQ:73419379} appropriate for outpatient follow up.       Collaboration of Care: {BH OP Collaboration of Care:21014065}  Patient/Guardian was advised Release of Information must be obtained prior to any record release in order to collaborate their care with an outside provider. Patient/Guardian was advised if they have not already done so to contact the registration department to sign all necessary forms in order for [place] to release information regarding their care.   Consent: Patient/Guardian gives verbal consent for treatment and assignment of benefits for services provided during this visit. Patient/Guardian expressed understanding and agreed to proceed.   Neysa Hotter, MD 8/9/202311:41 AM

## 2022-06-07 ENCOUNTER — Ambulatory Visit (HOSPITAL_COMMUNITY): Payer: Self-pay | Admitting: Psychiatry

## 2022-07-18 DIAGNOSIS — L299 Pruritus, unspecified: Secondary | ICD-10-CM | POA: Diagnosis not present

## 2022-07-18 DIAGNOSIS — H6121 Impacted cerumen, right ear: Secondary | ICD-10-CM | POA: Diagnosis not present

## 2022-08-30 DIAGNOSIS — M25562 Pain in left knee: Secondary | ICD-10-CM | POA: Diagnosis not present

## 2022-10-17 DIAGNOSIS — U071 COVID-19: Secondary | ICD-10-CM | POA: Diagnosis not present

## 2022-10-17 DIAGNOSIS — R6889 Other general symptoms and signs: Secondary | ICD-10-CM | POA: Diagnosis not present

## 2022-10-22 DIAGNOSIS — Z13228 Encounter for screening for other metabolic disorders: Secondary | ICD-10-CM | POA: Diagnosis not present

## 2022-10-22 DIAGNOSIS — E559 Vitamin D deficiency, unspecified: Secondary | ICD-10-CM | POA: Diagnosis not present

## 2022-10-22 DIAGNOSIS — Z01419 Encounter for gynecological examination (general) (routine) without abnormal findings: Secondary | ICD-10-CM | POA: Diagnosis not present

## 2022-10-22 DIAGNOSIS — R5383 Other fatigue: Secondary | ICD-10-CM | POA: Diagnosis not present

## 2022-10-22 DIAGNOSIS — Z1321 Encounter for screening for nutritional disorder: Secondary | ICD-10-CM | POA: Diagnosis not present

## 2022-10-22 DIAGNOSIS — N809 Endometriosis, unspecified: Secondary | ICD-10-CM | POA: Diagnosis not present

## 2022-10-22 DIAGNOSIS — N926 Irregular menstruation, unspecified: Secondary | ICD-10-CM | POA: Diagnosis not present

## 2022-10-22 DIAGNOSIS — Z130 Encounter for screening for diseases of the blood and blood-forming organs and certain disorders involving the immune mechanism: Secondary | ICD-10-CM | POA: Diagnosis not present

## 2022-10-22 DIAGNOSIS — Z13220 Encounter for screening for lipoid disorders: Secondary | ICD-10-CM | POA: Diagnosis not present

## 2022-10-22 DIAGNOSIS — Z1389 Encounter for screening for other disorder: Secondary | ICD-10-CM | POA: Diagnosis not present

## 2022-12-08 DIAGNOSIS — L659 Nonscarring hair loss, unspecified: Secondary | ICD-10-CM | POA: Diagnosis not present

## 2022-12-08 DIAGNOSIS — R5383 Other fatigue: Secondary | ICD-10-CM | POA: Diagnosis not present

## 2023-03-04 DIAGNOSIS — J069 Acute upper respiratory infection, unspecified: Secondary | ICD-10-CM | POA: Diagnosis not present

## 2023-03-04 DIAGNOSIS — R070 Pain in throat: Secondary | ICD-10-CM | POA: Diagnosis not present

## 2023-03-18 DIAGNOSIS — R509 Fever, unspecified: Secondary | ICD-10-CM | POA: Diagnosis not present

## 2023-03-18 DIAGNOSIS — U071 COVID-19: Secondary | ICD-10-CM | POA: Diagnosis not present

## 2023-03-18 DIAGNOSIS — Z20822 Contact with and (suspected) exposure to covid-19: Secondary | ICD-10-CM | POA: Diagnosis not present

## 2023-04-27 DIAGNOSIS — M7742 Metatarsalgia, left foot: Secondary | ICD-10-CM | POA: Diagnosis not present

## 2023-06-04 DIAGNOSIS — L648 Other androgenic alopecia: Secondary | ICD-10-CM | POA: Diagnosis not present

## 2023-06-04 DIAGNOSIS — L650 Telogen effluvium: Secondary | ICD-10-CM | POA: Diagnosis not present

## 2023-06-08 DIAGNOSIS — E063 Autoimmune thyroiditis: Secondary | ICD-10-CM | POA: Diagnosis not present

## 2023-06-08 DIAGNOSIS — R5383 Other fatigue: Secondary | ICD-10-CM | POA: Diagnosis not present

## 2023-06-08 DIAGNOSIS — L659 Nonscarring hair loss, unspecified: Secondary | ICD-10-CM | POA: Diagnosis not present

## 2023-06-11 ENCOUNTER — Other Ambulatory Visit: Payer: Self-pay | Admitting: Nurse Practitioner

## 2023-06-11 DIAGNOSIS — E063 Autoimmune thyroiditis: Secondary | ICD-10-CM

## 2023-06-17 ENCOUNTER — Ambulatory Visit
Admission: RE | Admit: 2023-06-17 | Discharge: 2023-06-17 | Disposition: A | Discharge status: Home / Self Care | Payer: BC Managed Care – PPO | Source: Ambulatory Visit | Attending: Nurse Practitioner | Admitting: Nurse Practitioner

## 2023-06-17 DIAGNOSIS — E063 Autoimmune thyroiditis: Secondary | ICD-10-CM | POA: Diagnosis not present
